# Patient Record
Sex: Male | Born: 1990 | Race: White | Hispanic: No | Marital: Married | Smoking: Never smoker
Health system: Southern US, Community
[De-identification: ages and names within clinical notes are randomized; demographics above are authoritative.]

---
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
## 2012-04-30 ENCOUNTER — Emergency Department (HOSPITAL_COMMUNITY): Payer: Self-pay

## 2012-04-30 ENCOUNTER — Encounter (HOSPITAL_COMMUNITY): Payer: Self-pay | Admitting: *Deleted

## 2012-04-30 ENCOUNTER — Emergency Department (HOSPITAL_COMMUNITY)
Admission: EM | Admit: 2012-04-30 | Discharge: 2012-04-30 | Disposition: A | Discharge status: Home / Self Care | Payer: Self-pay | Attending: Emergency Medicine | Admitting: Emergency Medicine

## 2012-04-30 DIAGNOSIS — Y998 Other external cause status: Secondary | ICD-10-CM | POA: Insufficient documentation

## 2012-04-30 DIAGNOSIS — Y9389 Activity, other specified: Secondary | ICD-10-CM | POA: Insufficient documentation

## 2012-04-30 DIAGNOSIS — S42033A Displaced fracture of lateral end of unspecified clavicle, initial encounter for closed fracture: Secondary | ICD-10-CM

## 2012-04-30 DIAGNOSIS — S42009A Fracture of unspecified part of unspecified clavicle, initial encounter for closed fracture: Secondary | ICD-10-CM | POA: Insufficient documentation

## 2012-04-30 MED ORDER — BACITRACIN ZINC 500 UNIT/GM EX OINT
TOPICAL_OINTMENT | Freq: Two times a day (BID) | CUTANEOUS | Status: DC
  Administered 2012-04-30: 12:00:00 via TOPICAL
  Filled 2012-04-30: qty 0.9
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
## 2012-04-30 NOTE — ED Notes (Signed)
Ortho tech at bedside pt/family refusing sling immobilizer
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
## 2014-04-11 ENCOUNTER — Encounter (HOSPITAL_COMMUNITY): Payer: Self-pay | Admitting: Emergency Medicine

## 2014-04-11 ENCOUNTER — Emergency Department (HOSPITAL_COMMUNITY)
Admission: EM | Admit: 2014-04-11 | Discharge: 2014-04-11 | Disposition: A | Discharge status: Home / Self Care | Payer: Self-pay | Attending: Emergency Medicine | Admitting: Emergency Medicine

## 2014-04-11 DIAGNOSIS — Z23 Encounter for immunization: Secondary | ICD-10-CM | POA: Insufficient documentation

## 2014-04-11 DIAGNOSIS — S61509A Unspecified open wound of unspecified wrist, initial encounter: Secondary | ICD-10-CM | POA: Insufficient documentation

## 2014-04-11 DIAGNOSIS — Y9389 Activity, other specified: Secondary | ICD-10-CM | POA: Insufficient documentation

## 2014-04-11 DIAGNOSIS — Y9269 Other specified industrial and construction area as the place of occurrence of the external cause: Secondary | ICD-10-CM | POA: Insufficient documentation

## 2014-04-11 DIAGNOSIS — S61512A Laceration without foreign body of left wrist, initial encounter: Secondary | ICD-10-CM

## 2014-04-11 DIAGNOSIS — W268XXA Contact with other sharp object(s), not elsewhere classified, initial encounter: Secondary | ICD-10-CM | POA: Insufficient documentation

## 2014-04-11 MED ORDER — TETANUS-DIPHTH-ACELL PERTUSSIS 5-2.5-18.5 LF-MCG/0.5 IM SUSP
0.5000 mL | Freq: Once | INTRAMUSCULAR | Status: AC
  Administered 2014-04-11: 0.5 mL via INTRAMUSCULAR
  Filled 2014-04-11: qty 0.5

## 2014-04-11 NOTE — ED Provider Notes (Signed)
CSN: [id number]     Arrival date & time 04/11/14  1549 History   First MD Initiated Contact with Patient 04/11/14 1606     Chief Complaint  Patient presents with  . Laceration     (Consider location/radiation/quality/duration/timing/severity/associated sxs/prior Treatment) Patient is a 23 y.o. male presenting with skin laceration. The history is provided by the patient. No language interpreter was used.  Laceration Location:  Shoulder/arm Shoulder/arm laceration location:  L wrist Length (cm):  1 Depth:  Cutaneous Bleeding: controlled   Foreign body present:  No foreign bodies Tetanus status:  Out of date   History reviewed. No pertinent past medical history. History reviewed. No pertinent past surgical history. No family history on file. History  Substance Use Topics  . Smoking status: Never Smoker   . Smokeless tobacco: Not on file  . Alcohol Use: Yes     Comment: occasionally    Review of Systems  Constitutional: Negative for fever.  Skin: Positive for wound.      Allergies  Review of patient's allergies indicates no known allergies.  Home Medications   Prior to Admission medications   Not on File   BP 120/56  Pulse 79  Temp(Src) 98.3 F (36.8 C) (Oral)  Resp 14  SpO2 98% Physical Exam  Constitutional: He appears well-nourished. No distress.  Musculoskeletal:  FROM all digits of left hand.  Skin:  1 cm linear laceration volar radial wrist. Not a full thickness wound. No bleeding.     ED Course  Procedures (including critical care time) Labs Review Labs Reviewed - No data to display  Imaging Review No results found.   EKG Interpretation None      MDM   Final diagnoses:  Laceration of wrist, left, initial encounter    Uncomplicated superficial laceration left wrist. Not intentional. Stable for discharge.    Arnoldo HookerShari A Isaih Bulger, PA-C 04/11/14 1620
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
## 2014-04-11 NOTE — Discharge Instructions (Signed)

## 2014-04-18 NOTE — ED Provider Notes (Signed)
Medical screening examination/treatment/procedure(s) were performed by non-physician practitioner and as supervising physician I was immediately available for consultation/collaboration.   EKG Interpretation None        Shakeem Stern Kamaron, MD 04/18/14 0022 

## 2016-03-10 ENCOUNTER — Ambulatory Visit (INDEPENDENT_AMBULATORY_CARE_PROVIDER_SITE_OTHER): Payer: Worker's Compensation | Admitting: Physician Assistant

## 2016-03-10 ENCOUNTER — Ambulatory Visit: Payer: Worker's Compensation

## 2016-03-10 VITALS — BP 124/80 | HR 71 | Temp 98.2°F | Resp 17 | Ht 68.0 in | Wt 153.0 lb

## 2016-03-10 DIAGNOSIS — S9032XA Contusion of left foot, initial encounter: Secondary | ICD-10-CM | POA: Diagnosis not present

## 2016-03-10 DIAGNOSIS — M79672 Pain in left foot: Secondary | ICD-10-CM

## 2016-03-10 NOTE — Patient Instructions (Addendum)
     IF you received an x-ray today, you will receive an invoice from [facility]. Please contact [facility] at [phone] with questions or concerns regarding your invoice.   IF you received labwork today, you will receive an invoice from [facility]. Please contact [facility] at [phone] with questions or concerns regarding your invoice.   Our billing staff will not be able to assist you with questions regarding bills from these companies.  You will be contacted with the lab results as soon as they are available. The fastest way to get your results is to activate your My Chart account. Instructions are located on the last page of this paperwork. If you have not heard from us regarding the results in 2 weeks, please contact this office.    Please ice the foot three times per day for 15 minutes.  I would like you to keep the foot elevated as much as possible.   This appears to be a bad bone bruise.  Use tylenol for the pain.   You will return in 5 days.  Foot Contusion  A foot contusion is a deep bruise to the foot. Contusions happen when an injury causes bleeding under the skin. Signs of bruising include pain, puffiness (swelling), and discolored skin. The contusion may turn blue, purple, or yellow. HOME CARE  Put ice on the injured area.  Put ice in a plastic bag.  Place a towel between your skin and the bag.  Leave the ice on for 15-20 minutes, 03-04 times a day.  Only take medicines as told by your doctor.  Use an elastic wrap only as told. You may remove the wrap for sleeping, showering, and bathing. Take the wrap off if you lose feeling (numb) in your toes, or they turn blue or cold. Put the wrap on more loosely.  Keep the foot raised (elevated) with pillows.  If your foot hurts, avoid standing or walking.  When your doctor says it is okay to use your foot, start using it slowly. If you have pain, lessen how much you use your  foot.  See your doctor as told. GET HELP RIGHT AWAY IF:   You have more redness, puffiness, or pain in your foot.  Your puffiness or pain does not get better with medicine.  You lose feeling in your foot, or you cannot move your toes.  Your foot turns cold or blue.  You have pain when you move your toes.  Your foot feels warm.  Your contusion does not get better in 2 days. MAKE SURE YOU:   Understand these instructions.  Will watch this condition.  Will get help right away if you or your child is not doing well or gets worse.   This information is not intended to replace advice given to you by your health care provider. Make sure you discuss any questions you have with your health care provider.   Document Released: 06/14/2008 Document Revised: 03/06/2012 Document Reviewed: 05/12/2015 Elsevier Interactive Patient Education [employer]2016 Elsevier Inc.

## 2016-03-10 NOTE — Progress Notes (Signed)
[facility] [address], [place] [address] [phone]  Date:  03/10/2016   Name:  Jason GerlachJames P Boies   DOB:  10/10/1990   MRN:  [id number]  PCP:  No primary care provider on file.    History of Present Illness:  Jason GerlachJames P Pigford is a 25 y.o. male patient who [facility] cc of left foot pain.   --left foot pain:   Dropped a 6 foot pad on foot.  He did that 3 hours ago.  Swelling.  No bleeding.  He can walk on heel.  No numbness and tingling at the dital digits.      There are no active problems to display for this patient.   No past medical history on file.  No past surgical history on file.  Social History  Substance Use Topics  . Smoking status: Never Smoker   . Smokeless tobacco: None  . Alcohol Use: Yes     Comment: occasionally    No family history on file.  No Known Allergies  Medication list has been reviewed and updated.  No current outpatient prescriptions on file prior to visit.   No current facility-administered medications on file prior to visit.    ROS ROS otherwise unremarkable unless listed above.  Physical Examination: BP 124/80 mmHg  Pulse 71  Temp(Src) 98.2 F (36.8 C) (Oral)  Resp 17  Ht 5\' 8"  (1.727 m)  Wt 153 lb (69.4 kg)  BMI 23.27 kg/m2  SpO2 98% Ideal Body Weight: Weight in (lb) to have BMI = 25: 164.1  Physical Exam  Constitutional: He is oriented to person, place, and time. He appears well-developed and well-nourished. No distress.  HENT:  Head: Normocephalic and atraumatic.  Eyes: Conjunctivae and EOM are normal. Pupils are equal, round, and reactive to light.  Cardiovascular: Normal rate.   Pulmonary/Chest: Effort normal. No respiratory distress.  Musculoskeletal:       Left ankle: No lateral malleolus, no medial malleolus and no head of 5th metatarsal tenderness found. Achilles tendon normal.  Left ecchymosis and swelling just proximal to the MTP. There is tenderness with passive flexion and  extension of the MTPs.  Neurological: He is alert and oriented to person, place, and time.  Skin: Skin is warm and dry. He is not diaphoretic.  Psychiatric: He has a normal mood and affect. His behavior is normal.   Dg Foot Complete Left  03/10/2016  CLINICAL DATA:  Heavy object fell on foot EXAM: LEFT FOOT - COMPLETE 3+ VIEW COMPARISON:  None. FINDINGS: Tarsal-metatarsal alignment is normal. No fracture is seen. Joint spaces appear normal. IMPRESSION: Negative. Electronically Signed   By: Dwyane DeePaul  Barry M.D.   On: 03/10/2016 15:21    Assessment and Plan: Jason GerlachJames P Tuccillo is a 25 y.o. male who is here today for left foot pain. Advised to rice at this time. Advised to use bandaging at home. Advised Tylenol for pain. He will return in 5 days for recheck.  Left foot pain - Plan: DG Foot Complete Left  Foot contusion, left, initial encounter  Trena PlattStephanie Agostino Gorin, PA-C [facility] 03/10/2016 2:55 PM

## 2016-11-10 ENCOUNTER — Ambulatory Visit (INDEPENDENT_AMBULATORY_CARE_PROVIDER_SITE_OTHER): Payer: Self-pay | Admitting: Family Medicine

## 2016-11-10 VITALS — BP 128/82 | HR 89 | Temp 98.3°F | Resp 18 | Ht 68.0 in | Wt 156.6 lb

## 2016-11-10 DIAGNOSIS — J029 Acute pharyngitis, unspecified: Secondary | ICD-10-CM

## 2016-11-10 DIAGNOSIS — B349 Viral infection, unspecified: Secondary | ICD-10-CM

## 2016-11-10 LAB — POCT RAPID STREP A (OFFICE): Rapid Strep A Screen: NEGATIVE

## 2016-11-10 LAB — POCT INFLUENZA A/B
Influenza A, POC: NEGATIVE
Influenza B, POC: NEGATIVE

## 2016-11-10 NOTE — Patient Instructions (Addendum)
Nice to meet you! It is my opinion that you likely have a viral illness that will resolve with time and rest. I would like for you to remain out of work today and as long as you are fever free without taking tylenol or ibuprofen, you may return to work on tomorrow. Take ibuprofen and gargle with warm salt water to sooth soreness of throat.  IF you received an x-ray today, you will receive an invoice from [facility]. Please contact [facility] at [phone] with questions or concerns regarding your invoice.   IF you received labwork today, you will receive an invoice from [facility]. Please contact [facility] at [phone] with questions or concerns regarding your invoice.   Our billing staff will not be able to assist you with questions regarding bills from these companies.  You will be contacted with the lab results as soon as they are available. The fastest way to get your results is to activate your My Chart account. Instructions are located on the last page of this paperwork. If you have not heard from [place] regarding the results in 2 weeks, please contact this office.      Viral Illness, Adult Viruses are tiny germs that can get into a person's body and cause illness. There are many different types of viruses, and they cause many types of illness. Viral illnesses can range from mild to severe. They can affect various parts of the body. Common illnesses that are caused by a virus include colds and the flu. Viral illnesses also include serious conditions such as HIV/AIDS (human immunodeficiency virus/acquired immunodeficiency syndrome). A few viruses have been linked to certain cancers. What are the causes? Many types of viruses can cause illness. Viruses invade cells in your body, multiply, and cause the infected cells to malfunction or die. When the cell dies, it releases more of the virus. When this happens, you develop symptoms of the illness, and the virus continues to  spread to other cells. If the virus takes over the function of the cell, it can cause the cell to divide and grow out of control, as is the case when a virus causes cancer. Different viruses get into the body in different ways. You can get a virus by:  Swallowing food or water that is contaminated with the virus.  Breathing in droplets that have been coughed or sneezed into the air by an infected person.  Touching a surface that has been contaminated with the virus and then touching your eyes, nose, or mouth.  Being bitten by an insect or animal that carries the virus.  Having sexual contact with a person who is infected with the virus.  Being exposed to blood or fluids that contain the virus, either through an open cut or during a transfusion. If a virus enters your body, your body's defense system (immune system) will try to fight the virus. You may be at higher risk for a viral illness if your immune system is weak. What are the signs or symptoms? Symptoms vary depending on the type of virus and the location of the cells that it invades. Common symptoms of the main types of viral illnesses include: Cold and flu viruses  Fever.  Headache.  Sore throat.  Muscle aches.  Nasal congestion.  Cough. Digestive system (gastrointestinal) viruses  Fever.  Abdominal pain.  Nausea.  Diarrhea. Liver viruses (hepatitis)  Loss of appetite.  Tiredness.  Yellowing of the skin (jaundice). Brain and spinal cord viruses  Fever.  Headache.  Stiff neck.  Nausea and vomiting.  Confusion or sleepiness. Skin viruses  Warts.  Itching.  Rash. Sexually transmitted viruses  Discharge.  Swelling.  Redness.  Rash. How is this treated? Viruses can be difficult to treat because they live within cells. Antibiotic medicines do not treat viruses because these drugs do not get inside cells. Treatment for a viral illness may include:  Resting and drinking plenty of  fluids.  Medicines to relieve symptoms. These can include over-the-counter medicine for pain and fever, medicines for cough or congestion, and medicines to relieve diarrhea.  Antiviral medicines. These drugs are available only for certain types of viruses. They may help reduce flu symptoms if taken early. There are also many antiviral medicines for hepatitis and HIV/AIDS. Some viral illnesses can be prevented with vaccinations. A common example is the flu shot. Follow these instructions at home: Medicines  Take over-the-counter and prescription medicines only as told by your health care provider.  If you were prescribed an antiviral medicine, take it as told by your health care provider. Do not stop taking the medicine even if you start to feel better.  Be aware of when antibiotics are needed and when they are not needed. Antibiotics do not treat viruses. If your health care provider thinks that you may have a bacterial infection as well as a viral infection, you may get an antibiotic.  Do not ask for an antibiotic prescription if you have been diagnosed with a viral illness. That will not make your illness go away faster.  Frequently taking antibiotics when they are not needed can lead to antibiotic resistance. When this develops, the medicine no longer works against the bacteria that it normally fights. General instructions  Drink enough fluids to keep your urine clear or pale yellow.  Rest as much as possible.  Return to your normal activities as told by your health care provider. Ask your health care provider what activities are safe for you.  Keep all follow-up visits as told by your health care provider. This is important. How is this prevented? Take these actions to reduce your risk of viral infection:  Eat a healthy diet and get enough rest.  Wash your hands often with soap and water. This is especially important when you are in public places. If soap and water are not  available, use hand sanitizer.  Avoid close contact with friends and family who have a viral illness.  If you travel to areas where viral gastrointestinal infection is common, avoid drinking water or eating raw food.  Keep your immunizations up to date. Get a flu shot every year as told by your health care provider.  Do not share toothbrushes, nail clippers, razors, or needles with other people.  Always practice safe sex. Contact a health care provider if:  You have symptoms of a viral illness that do not go away.  Your symptoms come back after going away.  Your symptoms get worse. Get help right away if:  You have trouble breathing.  You have a severe headache or a stiff neck.  You have severe vomiting or abdominal pain. This information is not intended to replace advice given to you by your health care provider. Make sure you discuss any questions you have with your health care provider. Document Released: 01/15/2016 Document Revised: 02/17/2016 Document Reviewed: 01/15/2016 Elsevier Interactive Patient Education  2017 [employer]

## 2016-11-10 NOTE — Progress Notes (Signed)
sj

## 2016-11-10 NOTE — Progress Notes (Signed)
   Patient ID: Scherrie GerlachJames P Considine, male    DOB: 01/24/1991, 26 y.o.   MRN: [id number]  PCP: No primary care provider on file.  Chief Complaint  Patient presents with  . Sore Throat    x 3 days    Subjective:  HPI 26 year old male presents for evaluation of sore throat x 3 days. He reports feeling feverish and reports a TMAX 100.5. Denies generalized body aches, chest congestion, and small cough. Reports a small nonproductive cough.   Social History   Social History  . Marital status: Married    Spouse name: N/A  . Number of children: N/A  . Years of education: N/A   Occupational History  . Not on file.   Social History Main Topics  . Smoking status: Never Smoker  . Smokeless tobacco: Never Used  . Alcohol use Yes     Comment: occasionally  . Drug use: No  . Sexual activity: Not on file   Other Topics Concern  . Not on file   Social History Narrative  . No narrative on file   Review of Systems See HPI  Prior to Admission medications   Not on File    Past Medical, Surgical Family and Social History reviewed and updated.    Objective:   Today's Vitals   11/10/16 1040  BP: 128/82  Pulse: 89  Resp: 18  Temp: 98.3 F (36.8 C)  TempSrc: Oral  SpO2: 100%  Weight: 156 lb 9.6 oz (71 kg)  Height: 5\' 8"  (1.727 m)    Wt Readings from Last 3 Encounters:  11/10/16 156 lb 9.6 oz (71 kg)  03/10/16 153 lb (69.4 kg)   Physical Exam  Constitutional: He is oriented to person, place, and time. He appears well-developed and well-nourished.  HENT:  Head: Normocephalic and atraumatic.  Right Ear: External ear normal.  Left Ear: External ear normal.  Nose: Nose normal.  Mouth/Throat: Oropharynx is clear and moist.  Eyes: Conjunctivae and EOM are normal. Pupils are equal, round, and reactive to light.  Neck: Normal range of motion. Neck supple.  Cardiovascular: Normal rate, regular rhythm, normal heart sounds and intact distal pulses.   Pulmonary/Chest: Effort normal  and breath sounds normal.  Lymphadenopathy:    He has no cervical adenopathy.  Neurological: He is alert and oriented to person, place, and time.  Skin: Skin is warm and dry.  Psychiatric: He has a normal mood and affect. His behavior is normal. Judgment and thought content normal.     Assessment & Plan:  1. Sore throat 2. Viral illness, treat symptomatically only. Illness will likely resolve with time and rest.  Plan: -Tylenol or Ibuprofen for fever management.  -Warm water salt gargles used to sooth throat soreness.  Return for follow-up if needed.  Godfrey PickKimberly S. Tiburcio PeaHarris, MSN, FNP-C [facility] [phone]

## 2016-11-13 ENCOUNTER — Encounter: Payer: Self-pay | Admitting: Family Medicine

## 2016-11-13 LAB — CULTURE, GROUP A STREP: Strep A Culture: NEGATIVE

## 2017-01-04 ENCOUNTER — Ambulatory Visit (INDEPENDENT_AMBULATORY_CARE_PROVIDER_SITE_OTHER): Payer: BLUE CROSS/BLUE SHIELD | Admitting: Family Medicine

## 2017-01-04 VITALS — BP 107/74 | HR 104 | Temp 101.9°F | Resp 18 | Ht 68.0 in | Wt 157.0 lb

## 2017-01-04 DIAGNOSIS — R509 Fever, unspecified: Secondary | ICD-10-CM

## 2017-01-04 DIAGNOSIS — J029 Acute pharyngitis, unspecified: Secondary | ICD-10-CM

## 2017-01-04 LAB — POCT INFLUENZA A/B
Influenza A, POC: NEGATIVE
Influenza B, POC: NEGATIVE

## 2017-01-04 LAB — POCT RAPID STREP A (OFFICE): Rapid Strep A Screen: NEGATIVE

## 2017-01-04 MED ORDER — AMOXICILLIN 875 MG PO TABS: 875.0000 mg | ORAL_TABLET | Freq: Two times a day (BID) | ORAL | 0 refills | Status: DC

## 2017-01-04 MED ORDER — ACETAMINOPHEN 325 MG PO TABS
1000.0000 mg | ORAL_TABLET | Freq: Once | ORAL | Status: AC
  Administered 2017-01-04: 975 mg via ORAL

## 2017-01-04 NOTE — Progress Notes (Signed)
   Jason Gillespie is a 26 y.o. male who presents to Primary Care at [facility] today for fevers and chills:  1.  Fevers and chills:  Patient with 4 hour history of fever, chills, myalgia, sore throat. He denies any upper respiratory symptoms. No cough. Only sick contact was coworker who was diagnosed recently with pneumonia.  Hasn't had anything to eat for the past 24 hours due to sore throat and general malaise. He is drinking fluids and tolerating this well.  Symptoms started 2 days ago. Started with sore throat and progressed rapidly to fever and chills. He drinks 12 beers a day and last time he had anything to drink was Monday evening. States he felt too bad to drink alcohol yesterday. He has started have palpitations today which he attributes to withdrawal from alcohol. No abdominal pain. No nausea vomiting. No diarrhea.  Seen here a few months ago and diagnosed with viral URI. States this feels much worse than that.  ROS as above.   PMH reviewed. Patient is a nonsmoker.   No past medical history on file. No past surgical history on file.  Medications reviewed. No current outpatient prescriptions on file.   No current facility-administered medications for this visit.      Physical Exam:  BP 107/74   Pulse (!) 104   Temp (!) 101.9 F (38.8 C) (Oral)   Resp 18   Ht  (1.727 m)   Wt 157 lb (71.2 kg)   SpO2 98%   BMI 23.87 kg/m  Gen:  Alert, cooperative patient who appears stated age in no acute distress.  Vital signs reviewed. Head: [facility]/AT.   Eyes:  EOMI, PERRL.   Ears:  External ears WNL, Bilateral TM's normal without retraction, redness or bulging. Nose:  Septum midline  Mouth:  Erythematous oropharynx with exudates and hypertrophic tonsils BL.   Pulm:  Clear to auscultation bilaterally with good air movement.  No wheezes or rales noted.   Cardiac:  Minimally tachycardic with regular rhythm. Abd:  Soft/nondistended/nontender.   Exts: Non edematous BL  LE, warm and well  perfused.   Results for orders placed or performed in visit on 01/04/17  POCT rapid strep A  Result Value Ref Range   Rapid Strep A Screen Negative Negative  POCT Influenza A/B  Result Value Ref Range   Influenza A, POC Negative Negative   Influenza B, POC Negative Negative    Assessment and Plan:  1.  Sore throat: 2.  Fevers and chills  Plan: - negative strep swab and negative flu here - plan to treat as strep throat -- high Centor critera and looks clinically like Strep.   - negative strep test as above -- however it was VERY difficult to get him to comply with testing, and I believe this was negative secondary to sample acquisition/insufficient sample - treating with Amoxicillin for presumed strep.  Did discuss with patient that if viral, abx will not help

## 2017-01-04 NOTE — Patient Instructions (Addendum)
It was good to see you today  Your strep and flu test were negative.  I'm still going to treat you for strep.  Take the amoxicillin twice daily for the next 7 days.   If this is a virus, the antibiotic won't help at all.   IF you received an x-ray today, you will receive an invoice from [facility]. Please contact [facility] at [phone] with questions or concerns regarding your invoice.   IF you received labwork today, you will receive an invoice from [facility]. Please contact [facility] at [phone] with questions or concerns regarding your invoice.   Our billing staff will not be able to assist you with questions regarding bills from these companies.  You will be contacted with the lab results as soon as they are available. The fastest way to get your results is to activate your My Chart account. Instructions are located on the last page of this paperwork. If you have not heard from [place] regarding the results in 2 weeks, please contact this office.

## 2017-02-07 ENCOUNTER — Ambulatory Visit (INDEPENDENT_AMBULATORY_CARE_PROVIDER_SITE_OTHER): Payer: BLUE CROSS/BLUE SHIELD | Admitting: Physician Assistant

## 2017-02-07 ENCOUNTER — Encounter: Payer: Self-pay | Admitting: Physician Assistant

## 2017-02-07 ENCOUNTER — Ambulatory Visit (INDEPENDENT_AMBULATORY_CARE_PROVIDER_SITE_OTHER): Payer: Worker's Compensation | Admitting: Physician Assistant

## 2017-02-07 VITALS — BP 131/78 | HR 95 | Temp 98.4°F | Resp 17 | Ht 67.76 in | Wt 153.8 lb

## 2017-02-07 DIAGNOSIS — S61411A Laceration without foreign body of right hand, initial encounter: Secondary | ICD-10-CM

## 2017-02-07 DIAGNOSIS — F419 Anxiety disorder, unspecified: Secondary | ICD-10-CM

## 2017-02-07 DIAGNOSIS — F1010 Alcohol abuse, uncomplicated: Secondary | ICD-10-CM

## 2017-02-07 DIAGNOSIS — F413 Other mixed anxiety disorders: Secondary | ICD-10-CM | POA: Diagnosis not present

## 2017-02-07 DIAGNOSIS — Z026 Encounter for examination for insurance purposes: Secondary | ICD-10-CM

## 2017-02-07 NOTE — Progress Notes (Signed)
  02/09/2017 8:42 AM   DOB: 03/13/1991 / MRN: [id number]  SUBJECTIVE:  Scherrie GerlachJames P Wileman is a 26 y.o. male presenting for a workers compensation claim in which he cut his right hand.This occurred while shoveling and "I thurn and scraped my hand on a vent pipe for gas logs."  TD up to date.  Denies loss of function, weakness, paresthesia.   Immunization History  Administered Date(s) Administered  . Tdap 04/11/2014     Review of Systems  Constitutional: Negative for chills, diaphoresis and fever.  Gastrointestinal: Negative for nausea.  Skin: Negative for rash.  Neurological: Negative for dizziness, sensory change, speech change, focal weakness and headaches.    The problem list and medications were reviewed and updated by myself where necessary and exist elsewhere in the encounter.   OBJECTIVE:  BP 131/78   Pulse 95   Temp 98.4 F (36.9 C) (Oral)   Resp 17   Ht 5' 7.76" (1.721 m)   Wt 153 lb 12.8 oz (69.8 kg)   SpO2 99%   BMI 23.55 kg/m   Physical Exam  Constitutional: He appears well-developed. He is active and cooperative.  Non-toxic appearance.  Cardiovascular: Normal rate.   Pulmonary/Chest: Effort normal. No tachypnea.  Musculoskeletal:       Hands: Neurological: He is alert. He has normal strength.  Skin: Skin is warm and dry. He is not diaphoretic. No pallor.  Vitals reviewed.  Risk and benefits discussed and verbal consent obtained. Anesthetic allergies reviewed. Patient anesthetized using 1:1 mix of 2% lidocaine with epi and Marcaine. The wound was cleansed thoroughly with soap and water. Sterile prep and drape. Wound closed with 3 throws using 4-0 Ethilon suture material. Hemostasis achieved. Mupirocin applied to the wound and bandage placed. The patient tolerated well. Wound instructions were provided and the patient is to return in 7 days for suture removal.   No results found for this or any previous visit (from the past 72 hour(s)).  No results  found.  ASSESSMENT AND PLAN:  Fayrene FearingJames was seen today for laceration, immunizations and suicidal.  Diagnoses and all orders for this visit:  Encounter related to worker's compensation claim  Laceration of right hand, foreign body presence unspecified, initial encounter: Skin flap laceration repaired without difficulty.  RTC in 7 days for removal. TD up to date.     The patient is advised to call or return to clinic if he does not see an improvement in symptoms, or to seek the care of the closest emergency department if he worsens with the above plan.   Deliah BostonMichael Shalon Salado, MHS, PA-C [facility] 02/09/2017 8:42 AM

## 2017-02-07 NOTE — Patient Instructions (Signed)

## 2017-02-07 NOTE — Progress Notes (Signed)
02/10/2017 6:07 PM   DOB: 01/28/1991 / MRN: [id number]  SUBJECTIVE:  Jason Gillespie is a 26 y.o. male presenting for suicidal ideation that occurs daily and tells me that the thought simply pop into head roughly daily. He has thought about killing himself with a gun to the behind the right ear.  He does not have any firearms and does not want them because he would not fell safe from himself with them. He has also though about other ways that would kill himself quickly as he does not want to suffer. He does not want to die. He has felt this way since he was [age] and denies any traumatic incidnents in childhood. Tells me the rest of his family is healthy.  He sleeps about 8-10 hours a night. He has never been able to stay up extended periods of time without sleep. He tells me he is not chronically happy.  Tells that he would like to be able to make friends but is unable to get past superficialities and says that people just don't make sense to him.  He did take an online test that told him he may have autism. He did not receive any help for this in childhood however he knew he was suffering.  Was able to have friendships with autistic kids. He has been trying to see a psychologist. He likes to brew beer and hot sauce and play music. He is not in a band but sounds like he would like to.  He does drink roughly 9-12 beers a night.  Does not drink in the morning.  Denies any problems with talking about his drinking.   He has No Known Allergies.   He  has no past medical history on file.    He  reports that he has never smoked. He has never used smokeless tobacco. He reports that he drinks alcohol. He reports that he does not use drugs. He  has no sexual activity history on file. The patient  has no past surgical history on file.  His family history is not on file.  Review of Systems  Neurological: Negative for headaches.  Psychiatric/Behavioral: Positive for depression, substance abuse and suicidal  ideas. Negative for hallucinations and memory loss. The patient is nervous/anxious. The patient does not have insomnia.     The problem list and medications were reviewed and updated by myself where necessary and exist elsewhere in the encounter.   OBJECTIVE:  There were no vitals taken for this visit.  Physical Exam  Psychiatric: His mood appears anxious. His affect is angry. His affect is not blunt, not labile and not inappropriate. His speech is not rapid and/or pressured and not delayed. He is agitated and hyperactive. He is not aggressive, not slowed, not withdrawn, not actively hallucinating and not combative. Thought content is not paranoid and not delusional. Cognition and memory are not impaired. He expresses inappropriate judgment. He exhibits a depressed mood. He expresses suicidal ideation. He expresses no homicidal ideation. He expresses no suicidal plans and no homicidal plans. He exhibits normal recent memory and normal remote memory.  Well groomed, no tics or tremor. Good eye contact. Patient paces throughout the interview.  He continually speaks and trails off, shaking his hands in frustrations.  He is attentive.     No results found for this or any previous visit (from the past 72 hour(s)).  No results found.  ASSESSMENT AND PLAN:  Diagnoses and all orders for this visit:  Other mixed anxiety  disorders: He has suicidal thoughts however he does not want to die. He does want to feel better and has lots of hobbies and no loss of interest in them.  I do question that he may have some form of autism spectrum disorder. I have placed a referral to [facility] in [place] or [place], as they are best suited to make a diagnosis and treat him.    -     Ambulatory referral to Neuropsychology  Alcohol abuse: He is cage negative.  I did not pursue counseling with this patient regarding this as he does not seem ready for that discussion today.  Defer to psychiatry for that problem as  well.     The patient is advised to call or return to clinic if he does not see an improvement in symptoms, or to seek the care of the closest emergency department if he worsens with the above plan.   Deliah Boston, MHS, PA-C [facility] 02/10/2017 6:07 PM

## 2017-02-10 DIAGNOSIS — F1010 Alcohol abuse, uncomplicated: Secondary | ICD-10-CM | POA: Insufficient documentation

## 2017-02-10 DIAGNOSIS — F419 Anxiety disorder, unspecified: Secondary | ICD-10-CM | POA: Insufficient documentation

## 2017-03-16 IMAGING — DX DG FOOT COMPLETE 3+V*L*
3 series · 3 of 3 positions shown · non-contrast
Comparison: None.

CLINICAL DATA: Heavy object fell on foot

EXAM:
LEFT FOOT - COMPLETE 3+ VIEW

[foot ap]
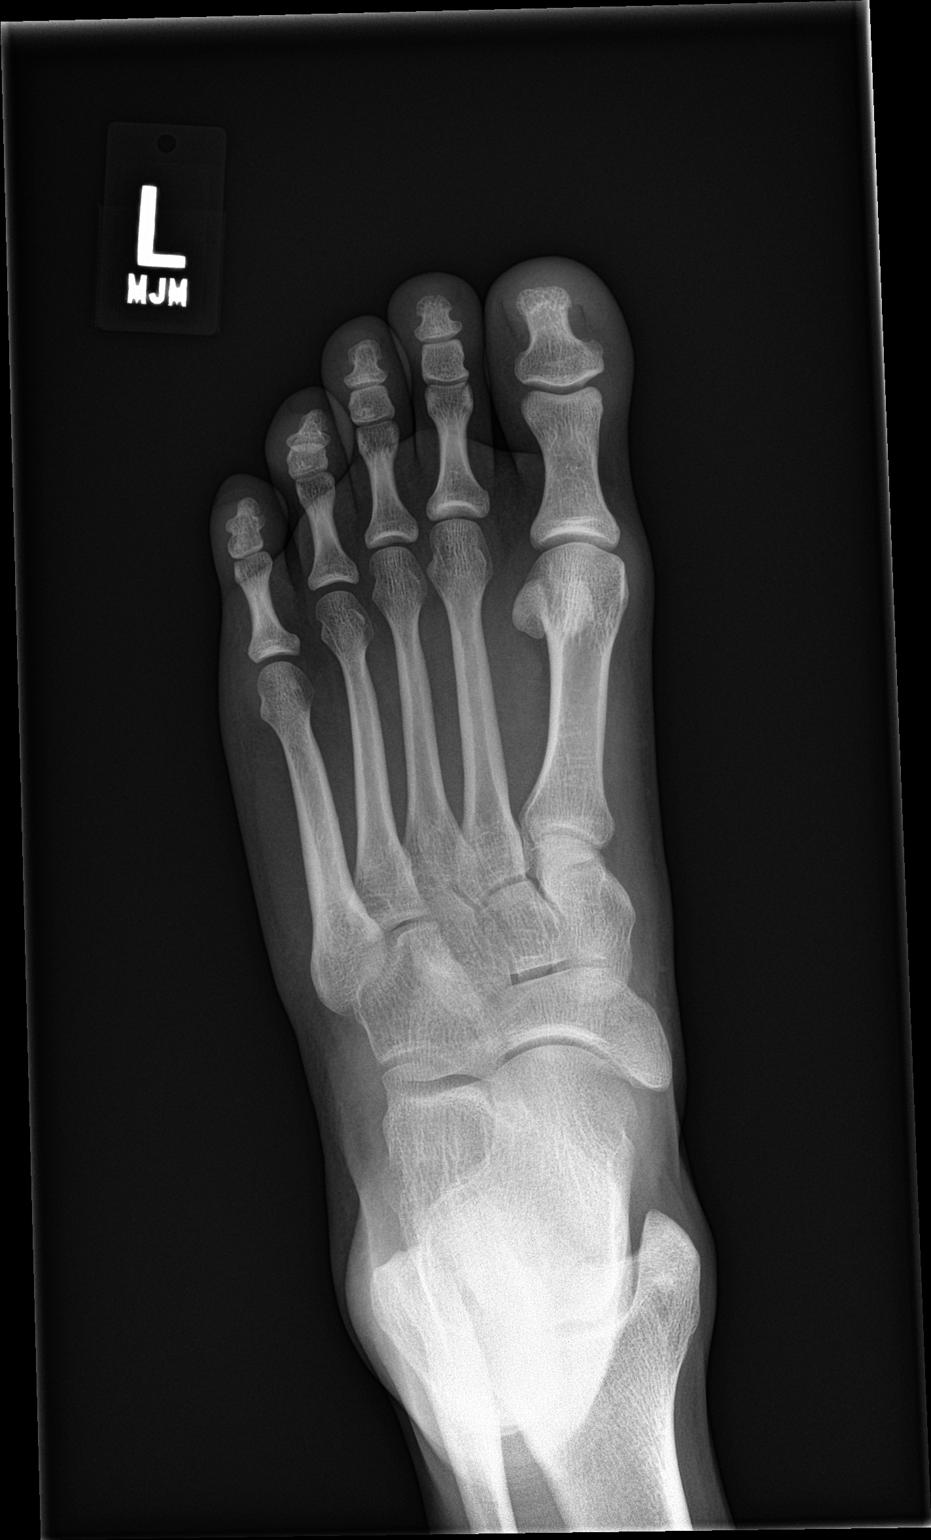

[foot obl]
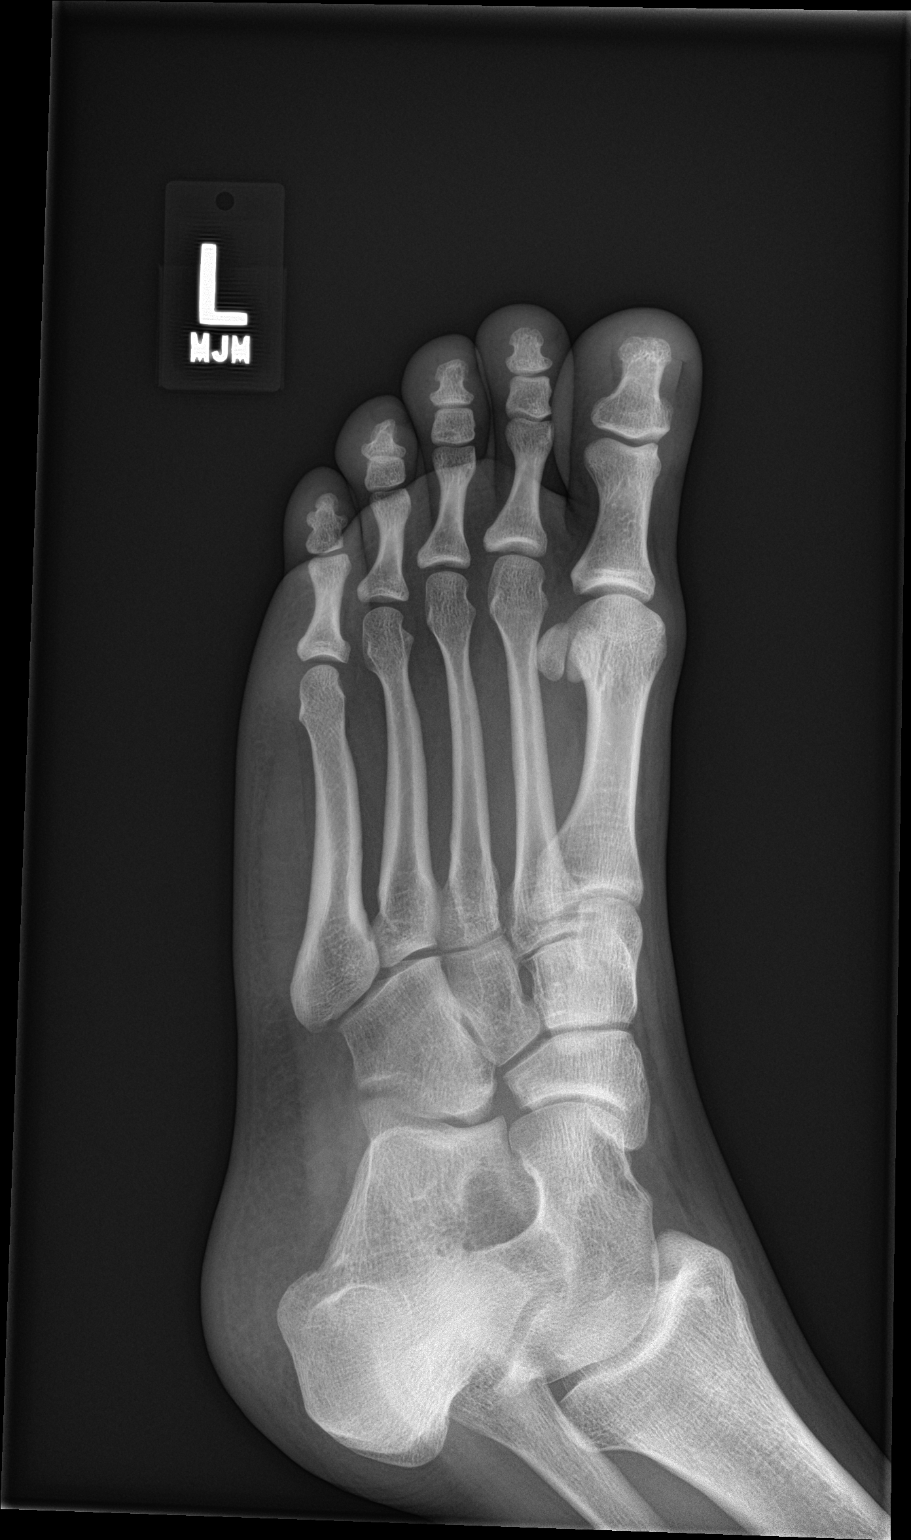

[foot lat]
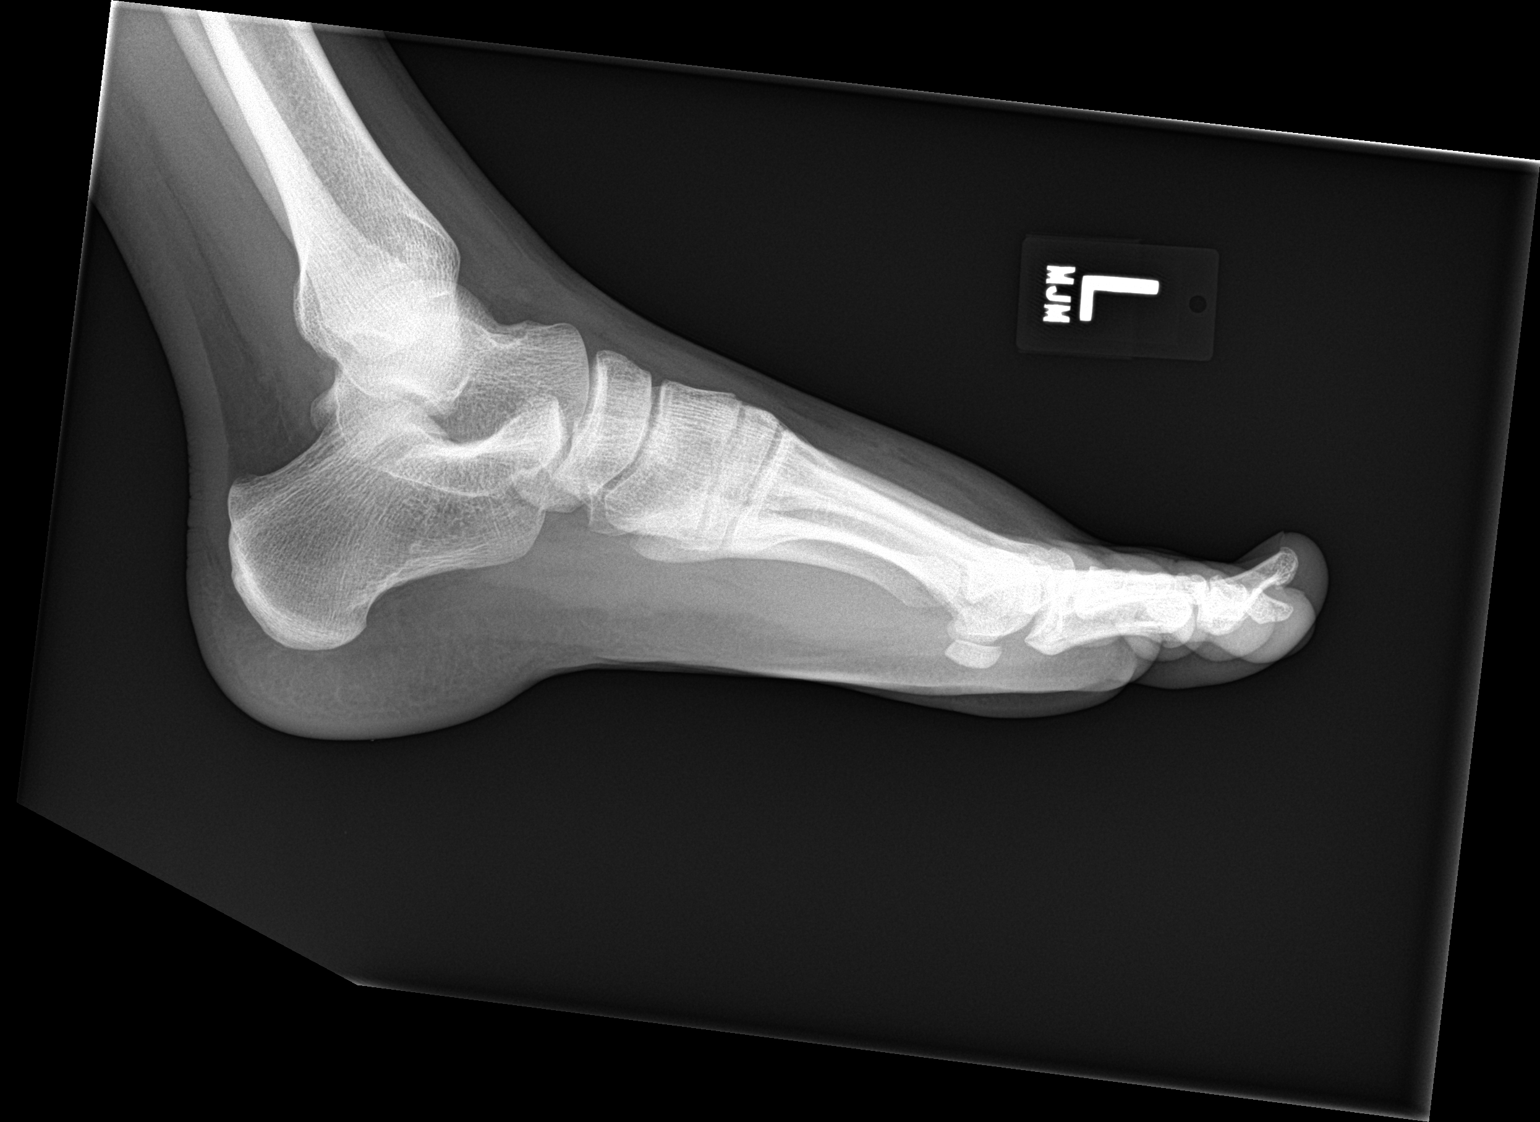

[3 of 3 positions shown; findings below may reference images not displayed]

FINDINGS: Tarsal-metatarsal alignment is normal. No fracture is seen. Joint
spaces appear normal.
IMPRESSION: Negative.
